# Patient Record
Sex: Female | Born: 1985 | Race: Black or African American | Hispanic: No | Marital: Single | Smoking: Never smoker
Health system: Southern US, Community
[De-identification: ages and names within clinical notes are randomized; demographics above are authoritative.]

## PROBLEM LIST (undated history)

## (undated) DIAGNOSIS — F410 Panic disorder [episodic paroxysmal anxiety] without agoraphobia: Secondary | ICD-10-CM

---
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
## 2005-01-05 ENCOUNTER — Emergency Department (HOSPITAL_COMMUNITY): Admission: EM | Admit: 2005-01-05 | Discharge: 2005-01-05 | Payer: Self-pay | Admitting: Emergency Medicine

## 2005-05-25 ENCOUNTER — Emergency Department (HOSPITAL_COMMUNITY): Admission: EM | Admit: 2005-05-25 | Discharge: 2005-05-25 | Payer: Self-pay | Admitting: Emergency Medicine

## 2005-05-27 ENCOUNTER — Emergency Department (HOSPITAL_COMMUNITY): Admission: EM | Admit: 2005-05-27 | Discharge: 2005-05-27 | Payer: Self-pay | Admitting: *Deleted

## 2005-06-18 ENCOUNTER — Emergency Department (HOSPITAL_COMMUNITY): Admission: EM | Admit: 2005-06-18 | Discharge: 2005-06-18 | Payer: Self-pay | Admitting: Emergency Medicine

## 2005-07-14 ENCOUNTER — Emergency Department (HOSPITAL_COMMUNITY): Admission: EM | Admit: 2005-07-14 | Discharge: 2005-07-15 | Payer: Self-pay | Admitting: Emergency Medicine

## 2005-09-19 ENCOUNTER — Emergency Department (HOSPITAL_COMMUNITY): Admission: EM | Admit: 2005-09-19 | Discharge: 2005-09-20 | Payer: Self-pay | Admitting: Emergency Medicine

## 2006-08-13 ENCOUNTER — Emergency Department (HOSPITAL_COMMUNITY): Admission: EM | Admit: 2006-08-13 | Discharge: 2006-08-13 | Payer: Self-pay | Admitting: Emergency Medicine

## 2006-08-16 ENCOUNTER — Inpatient Hospital Stay (HOSPITAL_COMMUNITY): Admission: AD | Admit: 2006-08-16 | Discharge: 2006-08-16 | Payer: Self-pay | Admitting: Obstetrics and Gynecology

## 2006-08-17 ENCOUNTER — Inpatient Hospital Stay (HOSPITAL_COMMUNITY): Admission: AD | Admit: 2006-08-17 | Discharge: 2006-08-17 | Payer: Self-pay | Admitting: Obstetrics & Gynecology

## 2006-11-04 ENCOUNTER — Inpatient Hospital Stay (HOSPITAL_COMMUNITY): Admission: AD | Admit: 2006-11-04 | Discharge: 2006-11-04 | Payer: Self-pay | Admitting: Obstetrics & Gynecology

## 2006-12-21 ENCOUNTER — Emergency Department (HOSPITAL_COMMUNITY): Admission: EM | Admit: 2006-12-21 | Discharge: 2006-12-21 | Payer: Self-pay | Admitting: Emergency Medicine

## 2009-07-11 IMAGING — CR DG KNEE 1-2V*R*
2 series · 2 of 2 positions shown · non-contrast
Comparison: none

CLINICAL DATA: Right knee pain laterally.  No injury.
 RIGHT KNEE ?2 VIEWS:
 Two views of the right knee were obtained.  The joint spaces are normal.  No effusion is seen.

[t knee ap right]
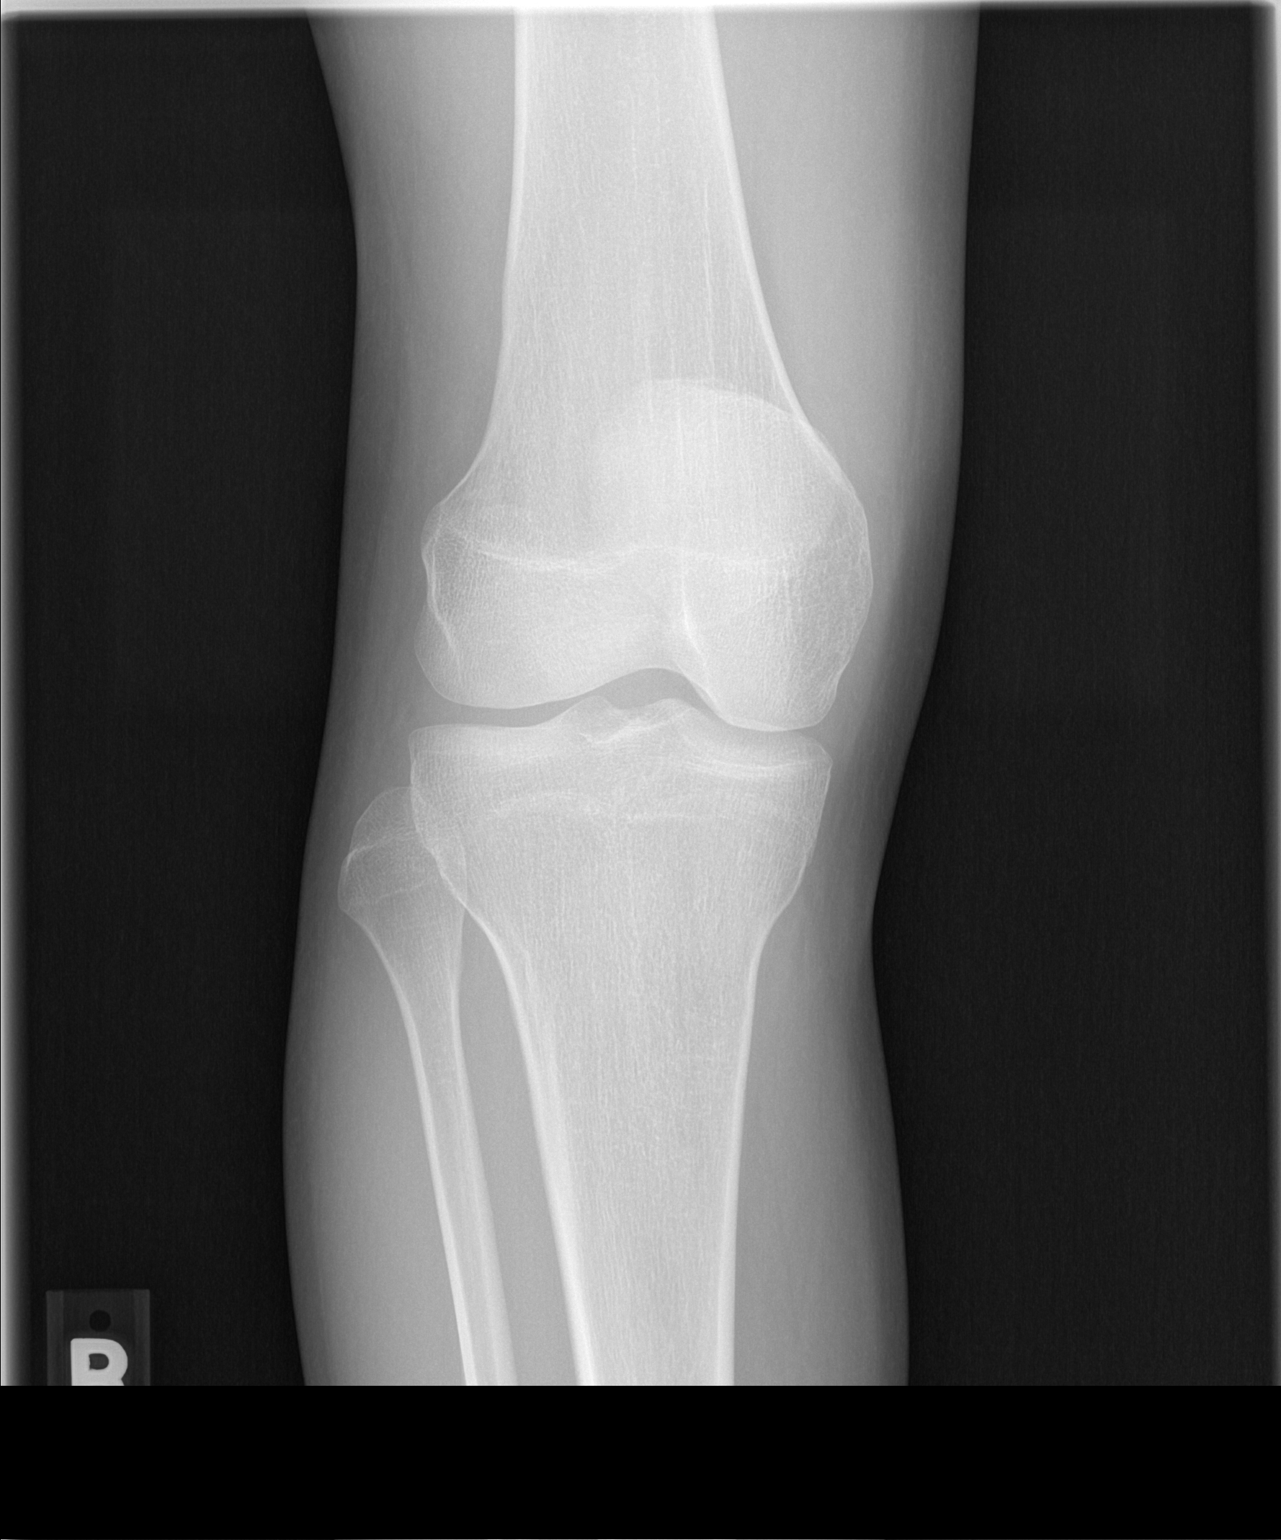

[t knee lat right]
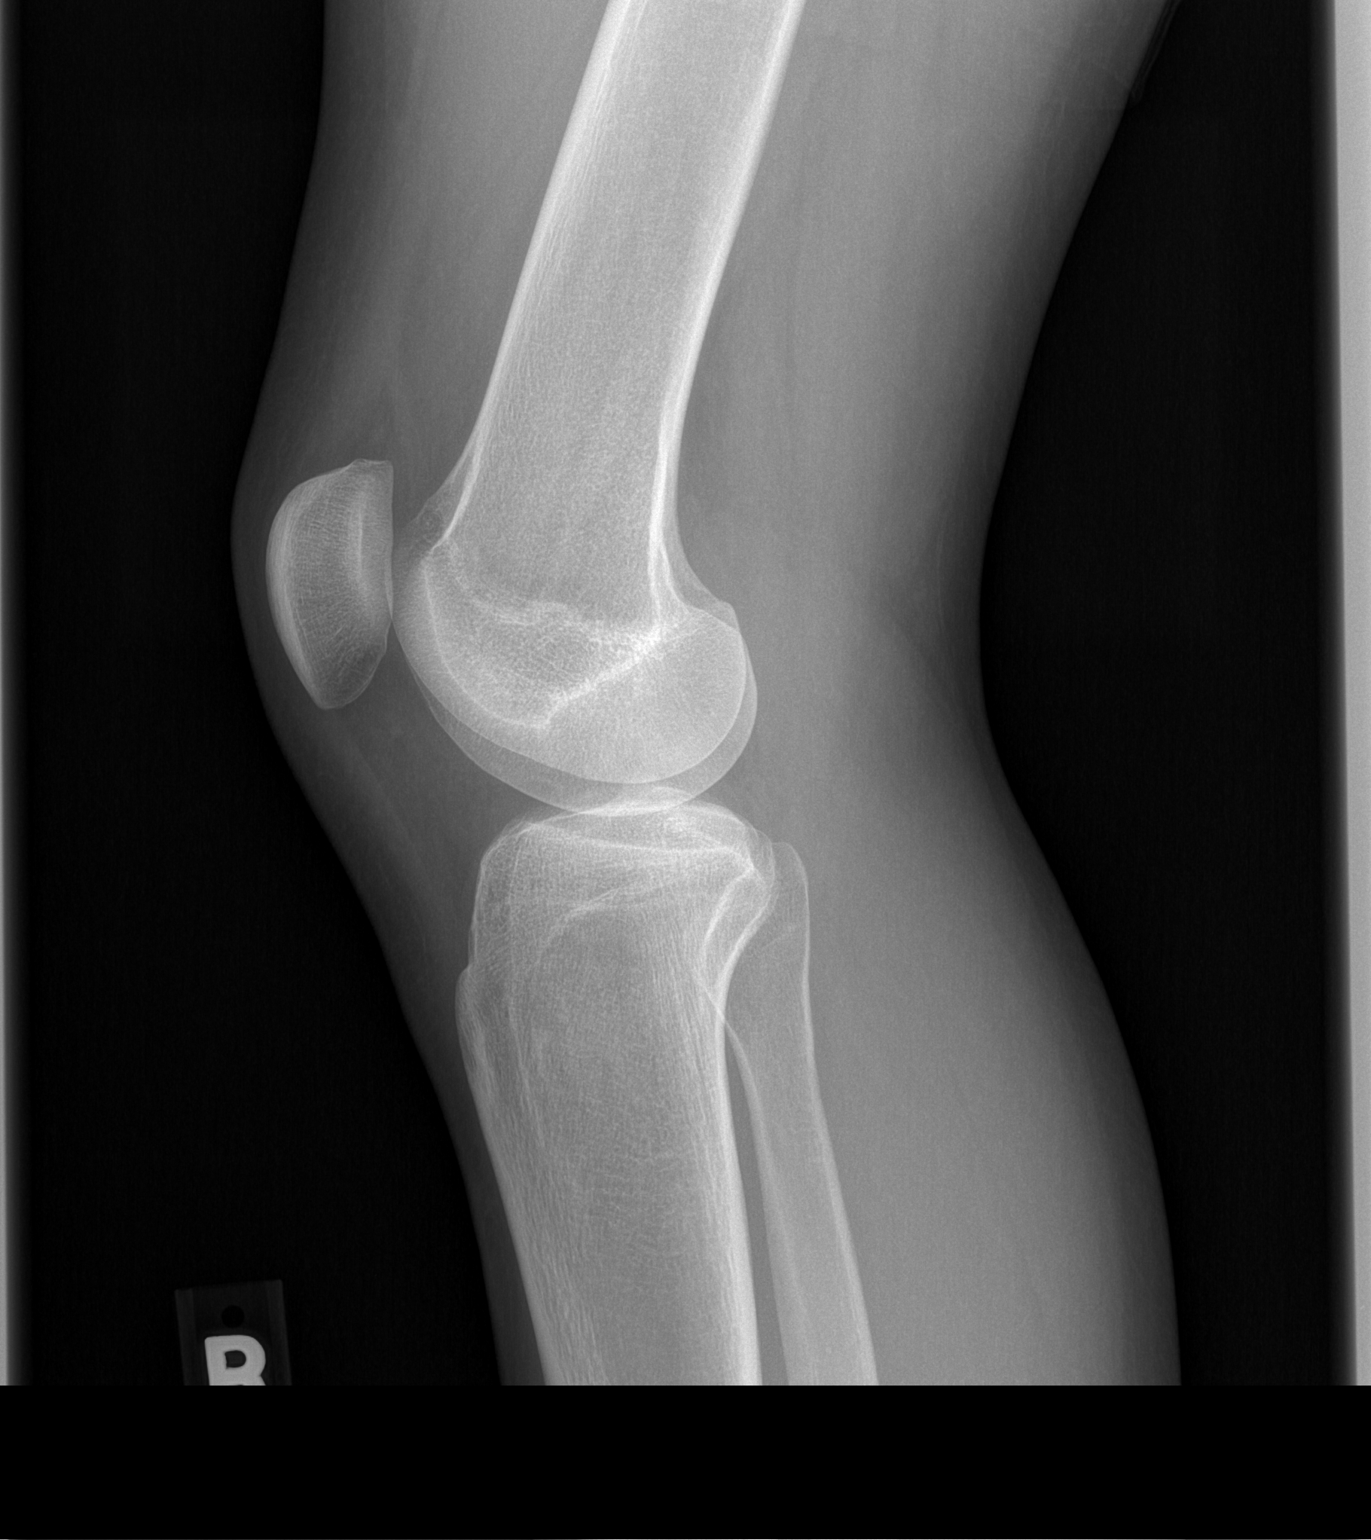

[2 of 2 positions shown; findings below may reference images not displayed]

IMPRESSION: Negative.

## 2009-07-14 IMAGING — US US TRANSVAGINAL NON-OB
1 series · 14 of 25 positions shown · non-contrast
Comparison: none

CLINICAL DATA: Abdominal pain.
TRANSABDOMINAL AND TRANSVAGINAL PELVIC ULTRASOUND ? 08/16/06:
TECHNIQUE: Both transabdominal and transvaginal ultrasound examinations of the pelvis were performed including evaluation of the uterus, ovaries, adnexal regions, and pelvic cul-de-sac.

[Series 1: us transvaginal non-ob · 0.20mm/px · 14 of 52 slices shown]
[im 1/52]
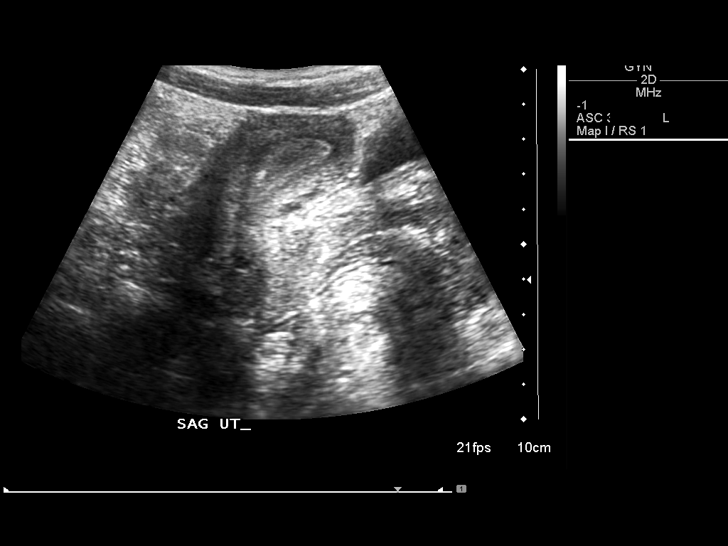
[im 5/52]
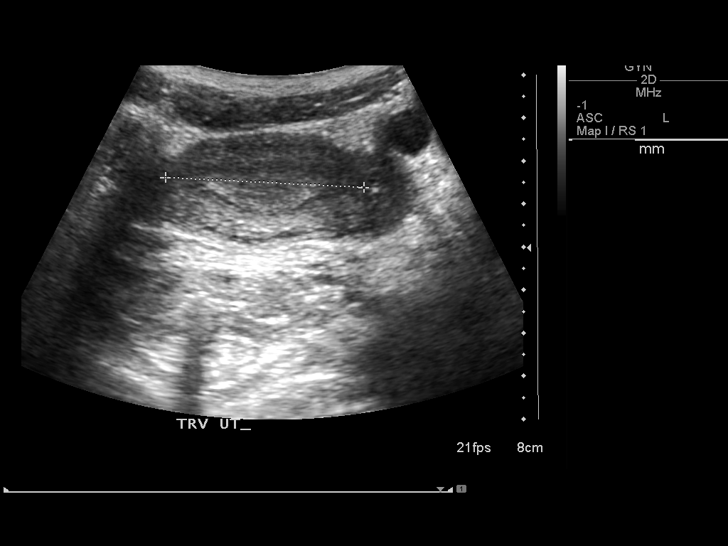
[im 9/52]
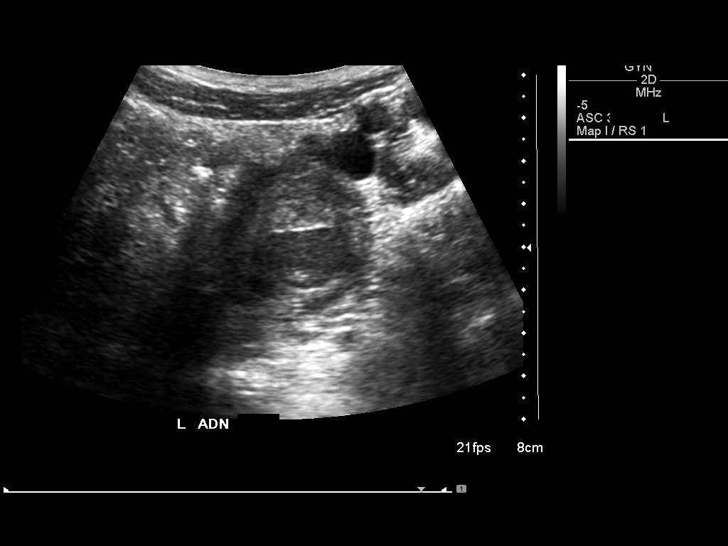
[im 13/52]
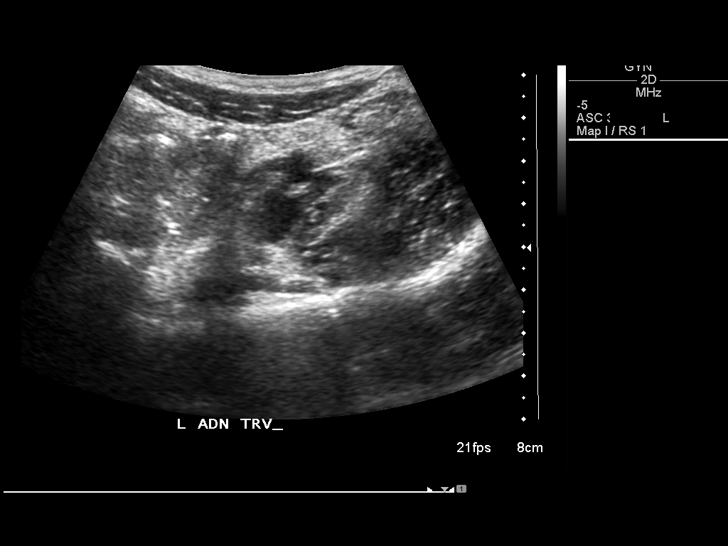
[im 18/52]
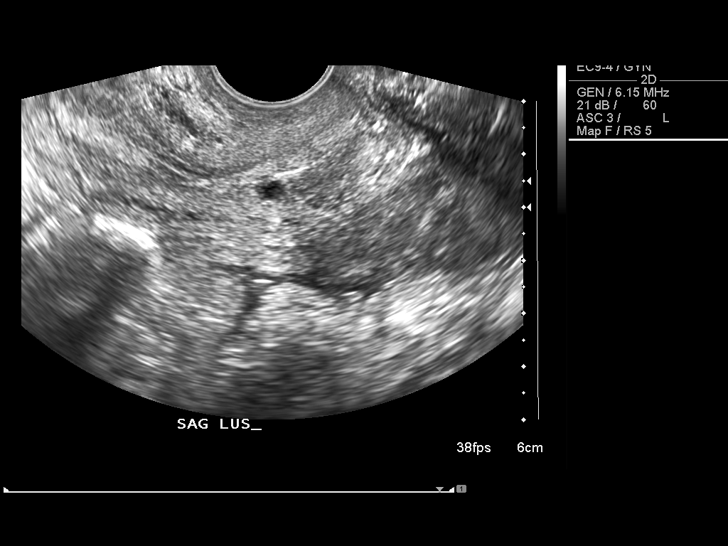
[im 20/52]
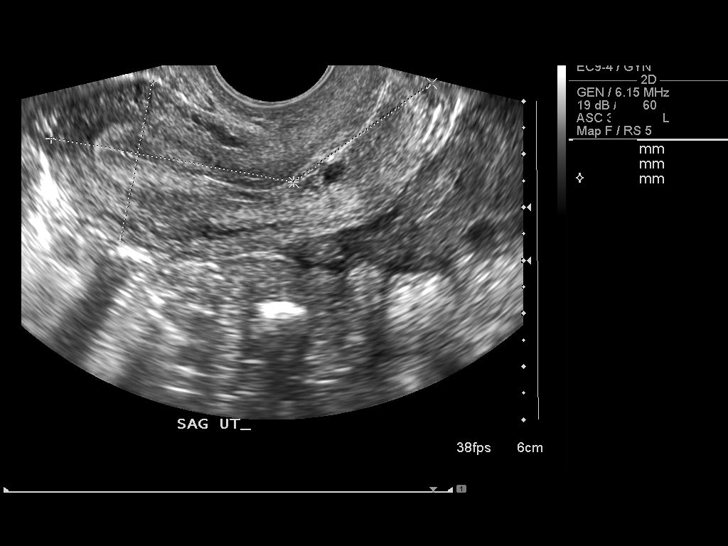
[im 24/52]
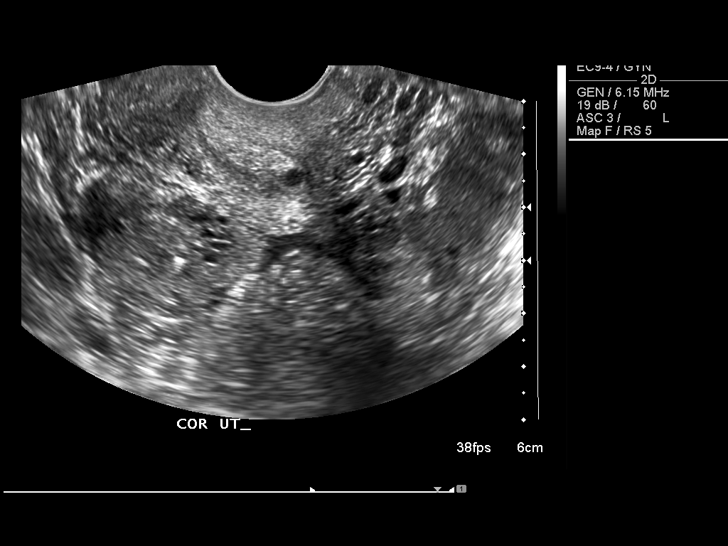
[im 28/52]
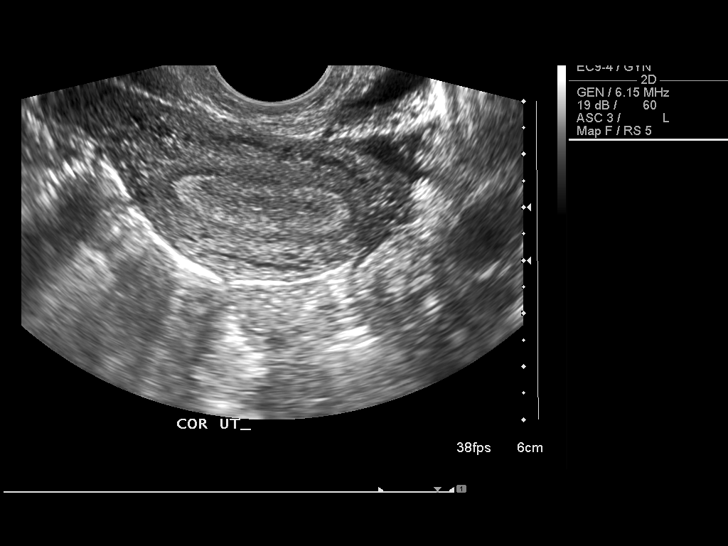
[im 32/52]
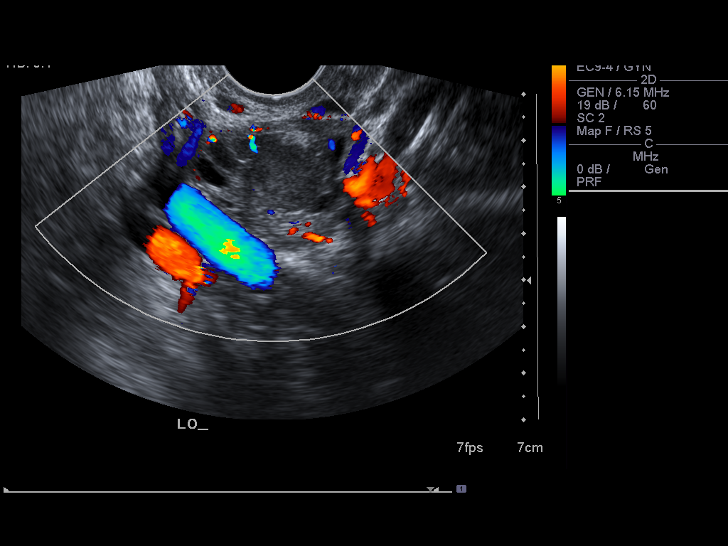
[im 35/52]
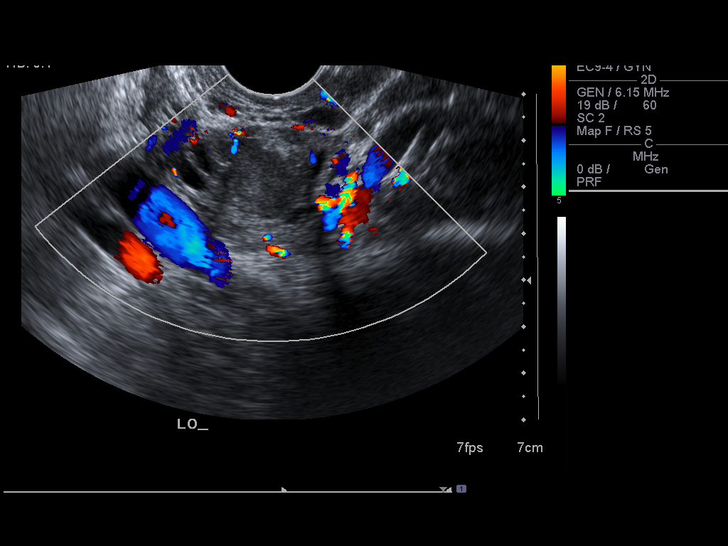
[im 39/52]
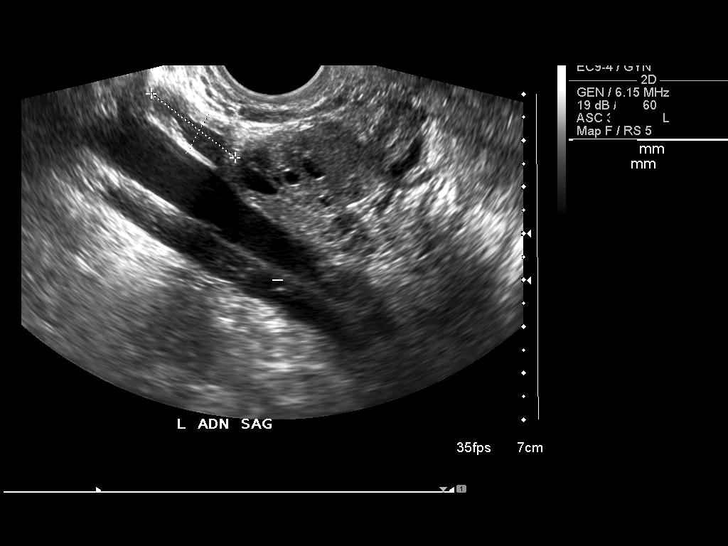
[im 43/52]
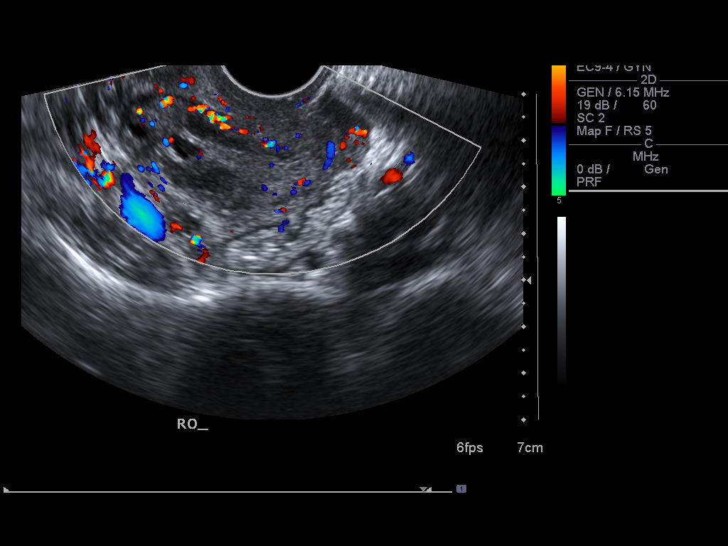
[im 47/52]
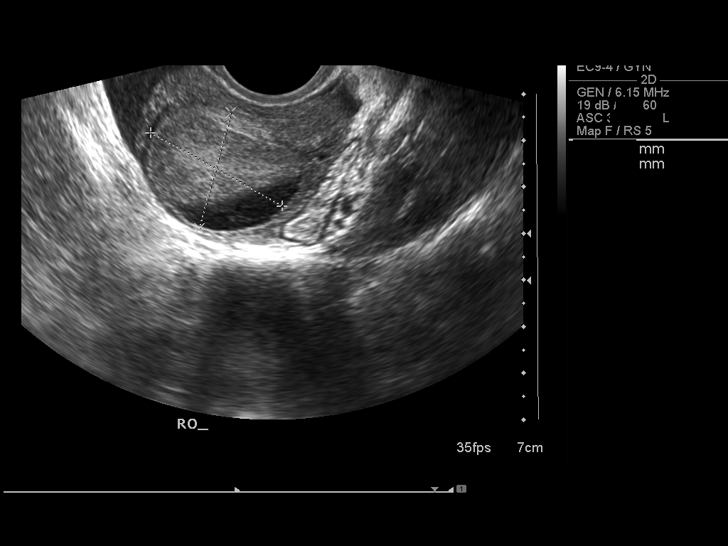
[im 52/52]
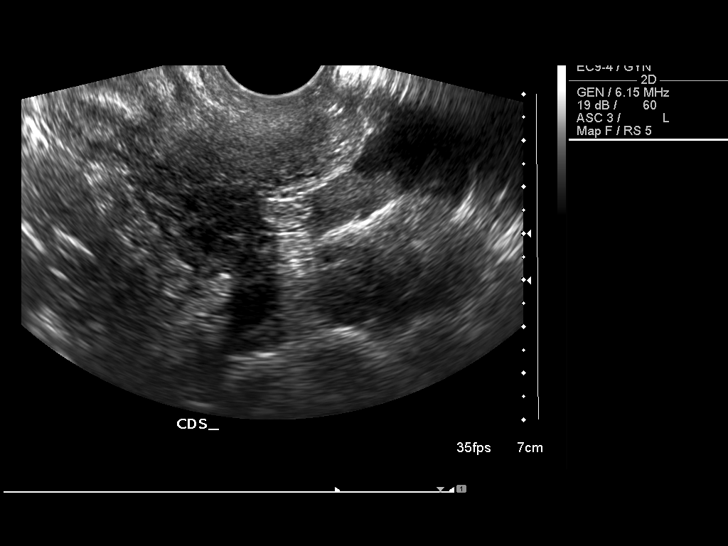

[14 of 25 positions shown; findings below may reference images not displayed]

FINDINGS: The uterus measures 7.7 cm in length and the fundus measures 3.1 x 5.0 cm in AP and width dimensions, respectively.  The endometrial complex measures 9.4 mm in thickness.  The right ovary measures 3.0 x 3.6 x 4.7 cm.  The left ovary measures 3.4 x 3.8 x 4.9 cm.  There is a small cervical Nabothian cyst.  There is a "complex appearance" adjacent to both ovaries without a definite definable wall or definite mass, except for a mass-like structure in the right adnexa, measuring 2.8x6.2x6.6cm. It presents as a solid echogenic portion with a surrounding rim of fluid. It is suspicious for a dermoid. Similar findings of the right ovary were reported on 01-20-04 ultrasound.  A small amount of complex fluid is noted in the cul-de-sac suspicious for PID.
IMPRESSION: Findings suspicious for PID.  Suspicion for right ovarian dermoid cyst.

## 2010-02-10 ENCOUNTER — Encounter: Payer: Self-pay | Admitting: *Deleted

## 2010-10-29 LAB — POCT PREGNANCY, URINE
Operator id: 13440
Preg Test, Ur: NEGATIVE

## 2010-10-29 LAB — WET PREP, GENITAL
Trich, Wet Prep: NONE SEEN
Yeast Wet Prep HPF POC: NONE SEEN

## 2010-10-29 LAB — GC/CHLAMYDIA PROBE AMP, GENITAL: Chlamydia, DNA Probe: NEGATIVE

## 2010-10-29 LAB — CBC
Hemoglobin: 12.5
Platelets: 218
RDW: 13.4

## 2010-11-03 LAB — WET PREP, GENITAL
Clue Cells Wet Prep HPF POC: NONE SEEN
Trich, Wet Prep: NONE SEEN
Yeast Wet Prep HPF POC: NONE SEEN

## 2010-11-03 LAB — CBC
MCHC: 33.2
MCV: 94.7
Platelets: 224
RDW: 13.1
RDW: 13.5
WBC: 4.4

## 2010-11-03 LAB — COMPREHENSIVE METABOLIC PANEL
AST: 23
Albumin: 3.4 — ABNORMAL LOW
Alkaline Phosphatase: 35 — ABNORMAL LOW
Chloride: 110
Creatinine, Ser: 0.81
GFR calc Af Amer: >60
Potassium: 3.5
Total Bilirubin: 0.8
Total Protein: 5.8 — ABNORMAL LOW

## 2010-11-03 LAB — URINALYSIS, ROUTINE W REFLEX MICROSCOPIC
Bilirubin Urine: NEGATIVE
Glucose, UA: NEGATIVE
Hgb urine dipstick: NEGATIVE
Ketones, ur: NEGATIVE
pH: 6

## 2010-11-03 LAB — GC/CHLAMYDIA PROBE AMP, GENITAL
Chlamydia, DNA Probe: NEGATIVE
GC Probe Amp, Genital: NEGATIVE

## 2010-11-03 LAB — SEDIMENTATION RATE: Sed Rate: 8

## 2015-08-17 ENCOUNTER — Emergency Department (HOSPITAL_COMMUNITY)
Admission: EM | Admit: 2015-08-17 | Discharge: 2015-08-17 | Disposition: A | Discharge status: Home / Self Care | Payer: Medicaid Other | Attending: Emergency Medicine | Admitting: Emergency Medicine

## 2015-08-17 ENCOUNTER — Encounter (HOSPITAL_COMMUNITY): Payer: Self-pay

## 2015-08-17 DIAGNOSIS — R42 Dizziness and giddiness: Secondary | ICD-10-CM | POA: Insufficient documentation

## 2015-08-17 DIAGNOSIS — R002 Palpitations: Secondary | ICD-10-CM | POA: Diagnosis not present

## 2015-08-17 DIAGNOSIS — F419 Anxiety disorder, unspecified: Secondary | ICD-10-CM | POA: Diagnosis not present

## 2015-08-17 HISTORY — DX: Panic disorder (episodic paroxysmal anxiety): F41.0

## 2015-08-17 LAB — CBC WITH DIFFERENTIAL/PLATELET
Basophils Absolute: 0 10*3/uL (ref 0.0–0.1)
Basophils Relative: 0 %
EOS ABS: 0 10*3/uL (ref 0.0–0.7)
Eosinophils Relative: 0 %
HEMATOCRIT: 35.7 % — AB (ref 36.0–46.0)
HEMOGLOBIN: 11.6 g/dL — AB (ref 12.0–15.0)
LYMPHS ABS: 1.4 10*3/uL (ref 0.7–4.0)
Lymphocytes Relative: 37 %
MCH: 30.9 pg (ref 26.0–34.0)
MCHC: 32.5 g/dL (ref 30.0–36.0)
MCV: 94.9 fL (ref 78.0–100.0)
MONOS PCT: 9 %
Monocytes Absolute: 0.3 10*3/uL (ref 0.1–1.0)
NEUTROS PCT: 54 %
Neutro Abs: 2 10*3/uL (ref 1.7–7.7)
Platelets: 212 10*3/uL (ref 150–400)
RBC: 3.76 MIL/uL — ABNORMAL LOW (ref 3.87–5.11)
RDW: 12.5 % (ref 11.5–15.5)
WBC: 3.7 10*3/uL — ABNORMAL LOW (ref 4.0–10.5)

## 2015-08-17 LAB — BASIC METABOLIC PANEL
Anion gap: 5 (ref 5–15)
BUN: 9 mg/dL (ref 6–20)
CHLORIDE: 110 mmol/L (ref 101–111)
CO2: 23 mmol/L (ref 22–32)
CREATININE: 0.81 mg/dL (ref 0.44–1.00)
Calcium: 9 mg/dL (ref 8.9–10.3)
GFR calc non Af Amer: >60 mL/min (ref >60)
Glucose, Bld: 94 mg/dL (ref 65–99)
Potassium: 3.7 mmol/L (ref 3.5–5.1)
Sodium: 138 mmol/L (ref 135–145)

## 2015-08-17 LAB — I-STAT BETA HCG BLOOD, ED (MC, WL, AP ONLY): I-stat hCG, quantitative: <5 m[IU]/mL (ref <5)

## 2015-08-17 MED ORDER — SODIUM CHLORIDE 0.9 % IV BOLUS (SEPSIS)
1000.0000 mL | Freq: Once | INTRAVENOUS | Status: AC
  Administered 2015-08-17: 1000 mL via INTRAVENOUS

## 2015-08-17 MED ORDER — LORAZEPAM 1 MG PO TABS
1.0000 mg | ORAL_TABLET | Freq: Once | ORAL | Status: AC
  Administered 2015-08-17: 1 mg via ORAL
  Filled 2015-08-17: qty 1

## 2015-08-17 MED ORDER — ONDANSETRON 4 MG PO TBDP: 4.0000 mg | ORAL_TABLET | Freq: Three times a day (TID) | ORAL | 0 refills | Status: AC | PRN

## 2015-08-17 MED ORDER — ONDANSETRON HCL 4 MG/2ML IJ SOLN
4.0000 mg | Freq: Once | INTRAMUSCULAR | Status: AC
  Administered 2015-08-17: 4 mg via INTRAVENOUS
  Filled 2015-08-17: qty 2

## 2015-08-17 NOTE — ED Provider Notes (Signed)
[facility] DEPT Provider Note   CSN: [id number] Arrival date & time: 08/17/15  0383  First Provider Contact:  First MD Initiated Contact with Patient 08/17/15 [phone]        History   Chief Complaint Chief Complaint  Patient presents with  . Palpitations  . Dizziness    HPI Tammie Harrison is a 30 y.o. female presents with dizziness and palpitations. She states she has a history of panic attacks and this feels similar except for the dizziness is new. Started around 3 AM. She was already awake and getting ready to drive home when she started feeling the symptoms. Called EMS but they told her her vital signs were normal and so she did not want to go to the ER. She states it has continued since then gotten worse. She feels like her heart is beating out of her chest and her chest is sinking in. She has to deep breathe but does not actually feel short of breath. She has had panic attacks for the past 3 years. She was in [place] a few months ago and received an Ativan prescription but did not fill it. Has not had diarrhea but has had vomiting over the last 24 hours. She is currently nauseated but no vomiting this morning. Denies urinary symptoms or missed menstrual cycles.  HPI  Past Medical History:  Diagnosis Date  . Panic anxiety syndrome     There are no active problems to display for this patient.   History reviewed. No pertinent surgical history.  OB History    No data available       Home Medications    Prior to Admission medications   Medication Sig Start Date End Date Taking? Authorizing Provider  ondansetron (ZOFRAN ODT) 4 MG disintegrating tablet Take 1 tablet (4 mg total) by mouth every 8 (eight) hours as needed for nausea or vomiting. 4mg  ODT q4 hours prn nausea/vomit 08/17/15   Pricilla Loveless, MD    Family History No family history on file.  Social History Social History  Substance Use Topics  . Smoking status: Never Smoker  . Smokeless tobacco: Never Used    . Alcohol use No     Allergies   Phenergan [promethazine hcl]   Review of Systems Review of Systems  Respiratory: Negative for shortness of breath.   Cardiovascular: Positive for palpitations. Negative for chest pain.  Gastrointestinal: Positive for nausea and vomiting. Negative for abdominal pain.  Genitourinary: Negative for dysuria and menstrual problem.  Neurological: Positive for dizziness and headaches.  All other systems reviewed and are negative.    Physical Exam Updated Vital Signs BP 102/76   Pulse 60   Temp 97.4 F (36.3 C) (Oral)   Resp 16   Ht 5\' 9"  (1.753 m)   Wt 139 lb 1.6 oz (63.1 kg)   SpO2 100%   BMI 20.54 kg/m   Physical Exam  Constitutional: She is oriented to person, place, and time. She appears well-developed and well-nourished.  HENT:  Head: Normocephalic and atraumatic.  Right Ear: External ear normal.  Left Ear: External ear normal.  Nose: Nose normal.  Eyes: EOM are normal. Pupils are equal, round, and reactive to light. Right eye exhibits no discharge. Left eye exhibits no discharge.  Cardiovascular: Normal rate, regular rhythm and normal heart sounds.   No murmur heard. Pulmonary/Chest: Effort normal and breath sounds normal. She has no wheezes. She has no rales.  Abdominal: Soft. She exhibits no distension. There is no tenderness.  Neurological:  She is alert and oriented to person, place, and time.  CN 3-12 grossly intact. 5/5 strength in all 4 extremities. Grossly normal sensation. Normal finger to nose.   Skin: Skin is warm and dry. She is not diaphoretic.  Psychiatric: Her mood appears anxious.  Nursing note and vitals reviewed.    ED Treatments / Results  Labs (all labs ordered are listed, but only abnormal results are displayed) Labs Reviewed  CBC WITH DIFFERENTIAL/PLATELET - Abnormal; Notable for the following:       Result Value   WBC 3.7 (*)    RBC 3.76 (*)    Hemoglobin 11.6 (*)    HCT 35.7 (*)    All other  components within normal limits  BASIC METABOLIC PANEL  I-STAT BETA HCG BLOOD, ED ([facility], [facility], [facility] ONLY)    EKG  EKG Interpretation  Date/Time:  Saturday August 17 2015 09:00:28 EDT Ventricular Rate:  91 PR Interval:  142 QRS Duration: 78 QT Interval:  374 QTC Calculation: 460 R Axis:   83 Text Interpretation:  Normal sinus rhythm Normal ECG no significant change since 2007 Confirmed by Demarqus Jocson MD, Zakariyah Freimark [phone]) on 08/17/2015 9:03:27 AM       Radiology No results found.  Procedures Procedures (including critical care time)  Medications Ordered in ED Medications  ondansetron (ZOFRAN) injection 4 mg (4 mg Intravenous Given 08/17/15 1003)  sodium chloride 0.9 % bolus 1,000 mL (0 mLs Intravenous Stopped 08/17/15 1044)  LORazepam (ATIVAN) tablet 1 mg (1 mg Oral Given 08/17/15 1003)     Initial Impression / Assessment and Plan / ED Course  I have reviewed the triage vital signs and the nursing notes.  Pertinent labs & imaging results that were available during my care of the patient were reviewed by me and considered in my medical decision making (see chart for details).  Clinical Course  Comment By Time  Sx appear c/w panic attack. ECG unremarkable. No actual CP. Will check labs, pregnancy, give fluids given vomiting yesterday and PO ativan. Lungs are clear, no hypoxia, do not think CXR would be of benefit. Pricilla Loveless, MD 07/29 [phone]  Patient feels much better. She would like to go home. Labs are unrevealing. My suspicion for ACS is so low I don't think further workup beyond ECG is needed given it is benign. Will d/c with antiemetics and give psych resources. Pricilla Loveless, MD 07/29 1040      Final Clinical Impressions(s) / ED Diagnoses   Final diagnoses:  Palpitations  Anxiety    New Prescriptions Discharge Medication List as of 08/17/2015 10:42 AM    START taking these medications   Details  ondansetron (ZOFRAN ODT) 4 MG disintegrating tablet Take 1 tablet (4 mg  total) by mouth every 8 (eight) hours as needed for nausea or vomiting.  ODT q4 hours prn nausea/vomit, Starting Sat 08/17/2015, Print         Pricilla Loveless, MD 08/17/15 1110

## 2015-08-17 NOTE — ED Triage Notes (Signed)
Patient here with dizziness and palpitations since last night. States that she has a panic disorder and unsure if related to that or something else is wrong. On arrival patient appears anxious and describes her palpitations as a doomed feeling, NAD. Arrived to triage ambulatory with steady gait

## 2015-08-17 NOTE — Discharge Instructions (Signed)
Substance Abuse Treatment Programs ° °Intensive Outpatient Programs °[facility]     °[address]      °[place], [place]                   °[phone]      ° °[facility] °[address] °[place], [place] °[phone] ° °[facility]     °(Inpatient and outpatient)     °[address]           °[phone]   ° °[facility] °[phone] (Suboxone and Methadone) ° °[address]      °[place], [place] [address]      °[phone]      ° °[address] °[place], [place] °852-3033 ° °[facility] (Outpatient/Inpatient, [facility])    °(insurance only) [phone]      °       °[facility] (Groups & Residential) °[place], [place] °[phone] ° °   °[facility]     °[address]     °[place], [place]      °[phone]      ° °[facility] (for caregivers and family) °[address] °[place], [place] °336-299-4655 ° ° ° ° ° °Residential Treatment Programs °[facility]      °[address], [place], [place] [address]  °[phone]      ° °[facility] °[address] [place], [place] [address] °[phone] ° °[facility]        °[phone]      ° °[facility] °[phone] ° °[facility]             °[address]                                         °[place], [place]                                                °877-615-2722 or 336-784-9470                              ° °[facility] [place] °[address] °[place], [address] °[phone] ° °[facility]    °[address]      °[place], [place]     °[phone]      ° °[facility] °[address] °, [place] °336-285-9073 ° °[facility]   °[address]     °[place], [place] [address]     °[phone]      °Admissions: 8am-3pm M-F ° °[facility] °[address] °[place],  [place] °336-227-7417 ° °[facility]: Residential Program (90 Days)   °[place], [place]      °336-725-8389 or 800-758-6077    ° °[facility] °[place], [place] °(Walk in Hours over the weekend or by referral) ° °[facility] °[address], [place], [place] [address] °([phone] ° °Crisis Mobile: [facility]:  1-877-626-1772 (for crisis response 24 hours a day) °[facility] Hotline:      1-800-256-2452 °Outpatient Psychiatry and Counseling ° °[facility]: Mobile Crisis   Management 24 hours:  1-877-626-1772 ° °[facility] sliding scale fee and walk in schedule: M-F 8am-12pm/1pm-3pm °[address]  °[place], [place] [address] °[phone] ° °[facility] °[address] °[place], [place] [address] °[phone] ° °[facility] ([facility])- new patient walk-in appointments available Monday - Friday 8am -3pm.          °[address] °[place], [place] [address] °336-676-6840 or crisis line- 336-676-6905 ° °[facility] Intensive Outpatient Therapy Program °[address] °[place], [place] [address] °336-832-9804 ° °[facility]                  °Crisis Services      °[phone]      °[address]     °[place], [place] [address]                ° °[facility]   °[facility] °[phone] °[address] °[place], [place] [address] ° ° °[facility]          °[address]  °[place], [place] [address]       °[phone] ° °[facility] °[address] °[place], [place] [address] °336-292-1510 ° °[facility]    °[address]    °[place], [place] [address]     °[phone]      ° °Parish McKinney, MD     °[address]     °[place] [address]     °336-282-1251     °  °[facility] °[address] °[place] [address] ° °[facility]     °[address]     °[place], [place]      °336-542-2076      ° °[facility] °Shamsher Ahluwalia, MD °[address] °[place], [place] [address] °[phone] ° °[facility]     °[address]     °[place], [place] [address]     °336-274-1237      ° °[facility] °[address] °[place], [place] [address] °336-854-4450 °Resources for Temporary Residential Assistance/Crisis Centers ° °DAY CENTERS °[facility] °M-F 8am-3pm   °[address] [place], [place] [address]   336-332-0824 °Services include: laundry, barbering, support groups, case management, phone  & computer access, showers, AA/NA mtgs, mental health/substance abuse nurse, job skills class, disability information, VA assistance, spiritual classes, etc.  ° °HOMELESS SHELTERS ° °[facility]     °[facility]   °[address], [place]     °[phone]       °       °[facility] (women and children)       °[address] °[place], [place] [address] °336-275-0820 °[email or web address] for application and process °Application Required ° °[facility]   °[address]    °[place]  [address]     °[phone]       °             °[facility] °[address] °,  [address] °336.273.5572 °336-235-0363(schedule application appt.) °Application Required ° °[facility] (women only)    °[address]     °[place],  [address]     °336-884-1039      °  Intake starts 6pm daily °Need valid ID, SSC, & Police report °[facility] [place] °[address] °[place], [place] °336-881-5420 °Application Required ° °[facility] (men only)     °[address]      °[place], [place]     °[phone]      ° °[facility] °(Pregnant women only) °[address] °, [place] °336-275-0206 ° °[facility]      °[address]      °[place], [place] [address]     °[phone]      °       °[facility] °[address] °[place], [place] °336-723-1848 °90 day commitment/SA/Application process ° °[facility](men only)     °[address]     °[place], [place]     °[phone]       °Check-in at 7pm     °       °[facility] °[address] °[place], [place] [address] °336-248-6684 °Men/Women/Women and Children must be there by 7 pm ° °[facility] °[place], [place] °336-722-8721                ° °

## 2015-11-25 ENCOUNTER — Encounter (HOSPITAL_COMMUNITY): Payer: Self-pay | Admitting: Emergency Medicine

## 2015-11-25 ENCOUNTER — Emergency Department (HOSPITAL_COMMUNITY)
Admission: EM | Admit: 2015-11-25 | Discharge: 2015-11-25 | Disposition: A | Discharge status: Home / Self Care | Payer: Medicaid Other | Attending: Emergency Medicine | Admitting: Emergency Medicine

## 2015-11-25 DIAGNOSIS — K0889 Other specified disorders of teeth and supporting structures: Secondary | ICD-10-CM | POA: Insufficient documentation

## 2015-11-25 DIAGNOSIS — Z5321 Procedure and treatment not carried out due to patient leaving prior to being seen by health care provider: Secondary | ICD-10-CM | POA: Diagnosis not present

## 2015-11-25 NOTE — ED Notes (Signed)
Patient left before being seen by medical staff.  She notified a tech on her way out the door.

## 2015-11-25 NOTE — ED Triage Notes (Signed)
Pt c/o R lower dental pain that has made her R jaw really sore. Pt sts she is unable to open and close her mouth without severe pain. Pt able to speak and move jaw while talking to this RN. Pt sts the dental pain started a long time ago but the jaw pain has worsened. A&Ox4 and ambulatory.

## 2015-11-25 NOTE — ED Provider Notes (Signed)
Patient left prior to being seen by ED provider.   Tammie Mornavid Aveena Bari, NP 11/25/15 1844    Loren Raceravid Yelverton, MD 11/25/15 [phone]

## 2015-11-26 ENCOUNTER — Encounter (HOSPITAL_COMMUNITY): Payer: Self-pay

## 2015-11-26 ENCOUNTER — Emergency Department (HOSPITAL_COMMUNITY)
Admission: EM | Admit: 2015-11-26 | Discharge: 2015-11-26 | Disposition: A | Discharge status: Home / Self Care | Payer: Medicaid Other | Attending: Emergency Medicine | Admitting: Emergency Medicine

## 2015-11-26 DIAGNOSIS — R6884 Jaw pain: Secondary | ICD-10-CM | POA: Diagnosis present

## 2015-11-26 DIAGNOSIS — M26621 Arthralgia of right temporomandibular joint: Secondary | ICD-10-CM | POA: Diagnosis not present

## 2015-11-26 DIAGNOSIS — M26629 Arthralgia of temporomandibular joint, unspecified side: Secondary | ICD-10-CM

## 2015-11-26 MED ORDER — IBUPROFEN 400 MG PO TABS
800.0000 mg | ORAL_TABLET | Freq: Once | ORAL | Status: AC
  Administered 2015-11-26: 800 mg via ORAL
  Filled 2015-11-26: qty 2

## 2015-11-26 MED ORDER — HYDROCODONE-ACETAMINOPHEN 5-325 MG PO TABS: 1.0000 | ORAL_TABLET | Freq: Four times a day (QID) | ORAL | 0 refills | Status: AC | PRN

## 2015-11-26 MED ORDER — IBUPROFEN 800 MG PO TABS: 800.0000 mg | ORAL_TABLET | Freq: Three times a day (TID) | ORAL | 0 refills | Status: AC | PRN

## 2015-11-26 NOTE — Discharge Instructions (Signed)
To find a primary care or specialty doctor please call [phone] or [phone] to access "[facility] Find a Doctor Service." ° °You may also go on the [facility] website at [email or web address] ° °There are also multiple [facility] practices throughout [place] that are frequently accepting new patients. You may find a clinic that is close to your home and contact them. ° °[facility] -  °[address] °[place] [address] °336-832-4444 ° ° °[facility] -  °[address] °[place] [address] °336-641-3245 ° ° °[facility] - °[address]  °[place] [address] °[phone] ° ° °

## 2015-11-26 NOTE — ED Triage Notes (Signed)
Pt complaining of R jaw pain. Pt complaining of bottom R tooth pain since yesterday.

## 2015-11-26 NOTE — ED Provider Notes (Signed)
TIME SEEN:  By signing my name below, I, Arianna Nassar, attest that this documentation has been prepared under the direction and in the presence of Enbridge EnergyKristen N Ward, DO.  Electronically Signed: Octavia HeirArianna Nassar, ED Scribe. 11/26/15. 3:44 AM.   CHIEF COMPLAINT:  Chief Complaint  Patient presents with  . Dental Pain     HPI:  HPI Comments: Tammie Harrison is a 30 y.o. female who presents to the Emergency Department complaining of sudden onset, gradual worsening, right jaw pain x 3 days. She has been having right Sided jaw pain. Pt expresses increased pain when opening her mouth and biting down. 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 States is similar. Denies dental pain. Denies facial swelling, erythema or warmth. No fever. Pt has not been seen by a dentist recently for her dental problem due to lapse of medicaid. She has taken advil to alleviate her pain without relief. No injury to the face or jaw.  ROS: See HPI Constitutional: no fever  Eyes: no drainage  ENT: no runny nose   Cardiovascular:  no chest pain  Resp: no SOB  GI: no vomiting GU: no dysuria Integumentary: no rash  Allergy: no hives  Musculoskeletal: no leg swelling  Neurological: no slurred speech ROS otherwise negative  PAST MEDICAL HISTORY/PAST SURGICAL HISTORY:  Past Medical History:  Diagnosis Date  . Panic anxiety syndrome     MEDICATIONS:  Prior to Admission medications   Medication Sig Start Date End Date Taking? Authorizing Provider  ondansetron (ZOFRAN ODT) 4 MG disintegrating tablet Take 1 tablet (4 mg total) by mouth every 8 (eight) hours as needed for nausea or vomiting. 4mg  ODT q4 hours prn nausea/vomit 08/17/15   Pricilla LovelessScott Goldston, MD    ALLERGIES:  Allergies  Allergen Reactions  . Phenergan [Promethazine Hcl] Anxiety    SOCIAL HISTORY:  Social History  Substance Use Topics  . Smoking status: Never Smoker  . Smokeless tobacco: Never Used  . Alcohol use  No    FAMILY HISTORY: History reviewed. No pertinent family history.  EXAM: BP 112/78 (BP Location: Right Arm)   Pulse 95   Temp 97.4 F (36.3 C) (Oral)   Resp 18   LMP 11/23/2015   SpO2 98%  CONSTITUTIONAL: Alert and oriented and responds appropriately to questions. Well-appearing; well-nourished, appears uncomfortable but is afebrile and nontoxic HEAD: Normocephalic EYES: Conjunctivae clear, PERRL ENT: normal nose; no rhinorrhea; moist mucous membranes; TTP over right TMJ without facial swelling, erythema or warmth; No pharyngeal erythema or petechiae, no tonsillar hypertrophy or exudate, no uvular deviation, no trismus or drooling, normal phonation, no stridor, no dental caries present, no drainable dental abscess noted, no Ludwig's angina, tongue sits flat in the bottom of the mouth, no angioedema, no facial erythema or warmth, no facial swelling; TMs are clear bilaterally without erythema, purulence, bulging, perforation, effusion.  No cerumen impaction or sign of foreign body in the external auditory canal. No inflammation, erythema or drainage from the external auditory canal. No signs of mastoiditis. No pain with manipulation of the pinna bilaterally. NECK: Supple, no meningismus, no LAD  CARD: RRR; S1 and S2 appreciated; no murmurs, no clicks, no rubs, no gallops RESP: Normal chest excursion without splinting or tachypnea; breath sounds clear and equal bilaterally; no wheezes, no rhonchi, no rales, no hypoxia or respiratory distress, speaking full sentences ABD/GI: Normal bowel sounds; non-distended; soft, non-tender, no rebound, no guarding, no peritoneal signs BACK:  The back appears normal  and is non-tender to palpation, there is no CVA tenderness EXT: Normal ROM in all joints; non-tender to palpation; no edema; normal capillary refill; no cyanosis, no calf tenderness or swelling    SKIN: Normal color for age and race; warm; no rash NEURO: Moves all extremities equally, sensation  to light touch intact diffusely, cranial nerves II through XII intact PSYCH: The patient's mood and manner are appropriate. Grooming and personal hygiene are appropriate.  MEDICAL DECISION MAKING: Patient here with tenderness over the right TMJ and pain with opening and closing her jaw. No trismus. No sign of mandibular dislocation. No injury to the face. No sign of facial cellulitis on exam. TMs are unremarkable. No sign of mastoiditis. No sign of dental infection Ludwig's angina. She is nontoxic, well-hydrated, in no significant distress. I do not feel she needs imaging of her face. We'll discharge with dental follow-up, PCP follow-up. Also discharge with pain medication. Recommended she apply ice to this area, have a soft diet for the next several days. She does not think that she grinds her teeth at night but she may benefit from dental follow-up for a mouth guard as she has had TMJ before. Discussed return precautions. She is comfortable with this plan.   At this time, I do not feel there is any life-threatening condition present. I have reviewed and discussed all results (EKG, imaging, lab, urine as appropriate), exam findings with patient/family. I have reviewed nursing notes and appropriate previous records.  I feel the patient is safe to be discharged home without further emergent workup and can continue workup as an outpatient as needed. Discussed usual and customary return precautions. Patient/family verbalize understanding and are comfortable with this plan.  Outpatient follow-up has been provided. All questions have been answered.   I personally performed the services described in this documentation, which was scribed in my presence. The recorded information has been reviewed and is accurate.    Layla MawKristen N Ward, DO 11/26/15 [phone]

## 2015-11-29 ENCOUNTER — Encounter (HOSPITAL_COMMUNITY): Payer: Self-pay | Admitting: Emergency Medicine

## 2015-11-29 ENCOUNTER — Emergency Department (HOSPITAL_COMMUNITY)
Admission: EM | Admit: 2015-11-29 | Discharge: 2015-11-29 | Disposition: A | Discharge status: Home / Self Care | Payer: Medicaid Other | Attending: Emergency Medicine | Admitting: Emergency Medicine

## 2015-11-29 DIAGNOSIS — Z791 Long term (current) use of non-steroidal anti-inflammatories (NSAID): Secondary | ICD-10-CM | POA: Insufficient documentation

## 2015-11-29 DIAGNOSIS — M26621 Arthralgia of right temporomandibular joint: Secondary | ICD-10-CM | POA: Insufficient documentation

## 2015-11-29 DIAGNOSIS — R6884 Jaw pain: Secondary | ICD-10-CM | POA: Diagnosis present

## 2015-11-29 DIAGNOSIS — M26609 Unspecified temporomandibular joint disorder, unspecified side: Secondary | ICD-10-CM

## 2015-11-29 MED ORDER — DIAZEPAM 5 MG PO TABS: 5.0000 mg | ORAL_TABLET | Freq: Two times a day (BID) | ORAL | 0 refills | Status: AC

## 2015-11-29 NOTE — ED Notes (Signed)
EDP Preston FleetingGlick notified of patient situation will be into see pt ASAP.

## 2015-11-29 NOTE — Discharge Instructions (Signed)
Take your medication as prescribed as needed for pain relief. I recommend continuing to take ibuprofen as prescribed. You may also apply ice to affected area for 15 minutes 3-4 times daily for additional pain relief. I recommend using a soft diet for the next few days until her symptoms have improved. Please follow up with a primary care provider from the Resource Guide provided below in one week if her symptoms did not improve. Please return to the Emergency Department if symptoms worsen or new onset of fever, facial/neck swelling, difficulty breathing, drooling due to being unable to swallow, unable to open jaw.

## 2015-11-29 NOTE — ED Triage Notes (Signed)
Pt from home with complaints of jaw pain on the right side. Pt states this has been going on since 11/6. Pt states she has taken motrin for it with no relief. Pt states her jaw pops when she chews or opens her mouth wide. Pt is tearful at time of assessment. Pt was seen for this 2 days ago and was given a given rx for Vicodin, but she did not fill it "because she can not take anything that strong". Pt states she has had this happen before and only muscle relaxers work for this.

## 2015-11-29 NOTE — ED Notes (Signed)
Pt reports right jaw pain Hx TMJ pain has been going on since 11/25/2015 and is worse tonight that before. OTC tylenol and Ibuprophen ineffective. Valium effective in the past.

## 2015-11-29 NOTE — ED Provider Notes (Signed)
[facility]-EMERGENCY DEPT Provider Note   CSN: [id number] Arrival date & time: 11/29/15  0144     History   Chief Complaint Chief Complaint  Patient presents with  . Jaw Pain    HPI Tammie Harrison is a 30 y.o. female.  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 Patient reports having associated pain to her right jaw which she notes is worse with eating, talking or opening her jaw. She reports her pain feels consistent with an episode of TMJ she has had in the past. She notes she was seen in the ED on 11/17 for the same complaint and was discharged home with ibuprofen and Vicodin. Patient reports she did not get her prescription of Vicodin filled due to and not working for her in the past and notes she has not had relief with ibuprofen. Patient reports the only thing that has alleviated her pain in the past is Valium. Denies any recent facial or head injury/trauma. Denies fever, facial/neck swelling, dental pain, gum swelling, trismus, drooling, voice change, difficulty breathing.   Past Medical History:  Diagnosis Date  . Panic anxiety syndrome     There are no active problems to display for this patient.   History reviewed. No pertinent surgical history.  OB History    No data available       Home Medications    Prior to Admission medications   Medication Sig Start Date End Date Taking? Authorizing Provider  diazepam (VALIUM) 5 MG tablet Take 1 tablet (5 mg total) by mouth 2 (two) times daily. 11/29/15   Barrett HenleNicole Elizabeth Nadeau, PA-C  HYDROcodone-acetaminophen (NORCO/VICODIN) 5-325 MG tablet Take 1-2 tablets by mouth every 6 (six) hours as needed. 11/26/15   Kristen N Ward, DO  ibuprofen (ADVIL,MOTRIN) 800 MG tablet Take 1 tablet (800 mg total) by mouth every 8 (eight) hours as needed for mild pain. 11/26/15   Kristen N Ward, DO  ondansetron (ZOFRAN ODT) 4 MG disintegrating tablet Take 1  tablet (4 mg total) by mouth every 8 (eight) hours as needed for nausea or vomiting. 4mg  ODT q4 hours prn nausea/vomit 08/17/15   Pricilla LovelessScott Goldston, MD    Family History No family history on file.  Social History Social History  Substance Use Topics  . Smoking status: Never Smoker  . Smokeless tobacco: Never Used  . Alcohol use No     Allergies   Phenergan [promethazine hcl]   Review of Systems Review of Systems  Constitutional: Negative for fever.  HENT: Negative for dental problem, drooling and facial swelling.        Jaw pain  Respiratory: Negative for shortness of breath.      Physical Exam Updated Vital Signs BP 139/90 (BP Location: Right Arm)   Pulse 119   Temp 97.9 F (36.6 C)   Resp 20   LMP 11/23/2015   SpO2 99%   Physical Exam  Constitutional: She is oriented to person, place, and time. She appears well-developed and well-nourished.  Pt appears anxious.  HENT:  Head: Normocephalic and atraumatic.  Mouth/Throat: Uvula is midline, oropharynx is clear and moist and mucous membranes are normal. No oral lesions. No trismus in the jaw. Normal dentition. No dental abscesses or uvula swelling. No oropharyngeal exudate, posterior oropharyngeal edema, posterior oropharyngeal erythema or tonsillar abscesses. No tonsillar exudate.  TTP over right TMJ without swelling, erythema or warmth. No facial or neck  swelling. Floor of mouth soft. Patient tolerating secretions. No trismus or drooling.   Eyes: Conjunctivae and EOM are normal. Right eye exhibits no discharge. Left eye exhibits no discharge. No scleral icterus.  Neck: Normal range of motion. Neck supple.  Cardiovascular: Regular rhythm, normal heart sounds and intact distal pulses.   Mildy tachycardic, HR 106.   Pulmonary/Chest: Effort normal and breath sounds normal. No stridor. No respiratory distress. She has no wheezes. She has no rales. She exhibits no tenderness.  Abdominal: Soft. She exhibits no distension.    Musculoskeletal: She exhibits no edema.  Lymphadenopathy:    She has no cervical adenopathy.  Neurological: She is alert and oriented to person, place, and time.  Skin: Skin is warm and dry.  Nursing note and vitals reviewed.    ED Treatments / Results  Labs (all labs ordered are listed, but only abnormal results are displayed) Labs Reviewed - No data to display  EKG  EKG Interpretation None       Radiology No results found.  Procedures Procedures (including critical care time)  Medications Ordered in ED Medications - No data to display   Initial Impression / Assessment and Plan / ED Course  I have reviewed the triage vital signs and the nursing notes.  Pertinent labs & imaging results that were available during my care of the patient were reviewed by me and considered in my medical decision making (see chart for details).  Clinical Course     Patient presents with right jaw pain which she states is consistent with pain she has had in the past related to TMJ. She notes she was seen in the ED 2 days ago and was given a prescription for ibuprofen and Vicodin, denies getting prescription filled for Vicodin but reports having no relief with ibuprofen. Reports only having relief with Valium in the past. Denies fever, swelling, dental pain. VSS. Exam revealed tenderness over right TMJ, no associated facial swelling, dental infection/abscess, trismus, drooling, difficulty breathing. Suspect patient's symptoms are likely due to TMJ. Plan to discharge patient home with prescription of Valium, advised to continue taking ibuprofen as prescribed and discussed symptomatic treatment including soft diet. Advised patient to follow up with PCP as needed. Discussed return precautions.  Final Clinical Impressions(s) / ED Diagnoses   Final diagnoses:  TMJ (temporomandibular joint syndrome)    New Prescriptions New Prescriptions   DIAZEPAM (VALIUM) 5 MG TABLET    Take 1 tablet (5 mg  total) by mouth 2 (two) times daily.     Barrett HenleNicole Elizabeth Nadeau, PA-C 11/29/15 [address]    Nicole Elizabeth SaynerNadeau, PA-C 11/29/15 0250    Dione Boozeavid Glick, MD 11/29/15 [phone]
